# Patient Record
Sex: Male | Born: 2000 | Race: Black or African American | Hispanic: No | Marital: Single | Smoking: Never smoker
Health system: Southern US, Community
[De-identification: ages and names within clinical notes are randomized; demographics above are authoritative.]

---

## 2019-10-29 ENCOUNTER — Other Ambulatory Visit: Payer: Self-pay

## 2019-10-29 DIAGNOSIS — Z202 Contact with and (suspected) exposure to infections with a predominantly sexual mode of transmission: Secondary | ICD-10-CM

## 2019-10-30 ENCOUNTER — Encounter: Payer: Self-pay | Admitting: Family Medicine

## 2019-10-30 ENCOUNTER — Ambulatory Visit (INDEPENDENT_AMBULATORY_CARE_PROVIDER_SITE_OTHER): Payer: Self-pay | Admitting: Family Medicine

## 2019-10-30 ENCOUNTER — Other Ambulatory Visit: Payer: Self-pay | Admitting: Family Medicine

## 2019-10-30 VITALS — BP 119/71 | HR 88 | Temp 98.6°F | Ht 75.0 in | Wt 170.6 lb

## 2019-10-30 DIAGNOSIS — Z7689 Persons encountering health services in other specified circumstances: Secondary | ICD-10-CM

## 2019-10-30 DIAGNOSIS — Z09 Encounter for follow-up examination after completed treatment for conditions other than malignant neoplasm: Secondary | ICD-10-CM

## 2019-10-30 DIAGNOSIS — Z0000 Encounter for general adult medical examination without abnormal findings: Secondary | ICD-10-CM

## 2019-10-30 DIAGNOSIS — Z202 Contact with and (suspected) exposure to infections with a predominantly sexual mode of transmission: Secondary | ICD-10-CM

## 2019-10-30 DIAGNOSIS — R1031 Right lower quadrant pain: Secondary | ICD-10-CM | POA: Insufficient documentation

## 2019-10-30 DIAGNOSIS — R1032 Left lower quadrant pain: Secondary | ICD-10-CM | POA: Insufficient documentation

## 2019-10-30 DIAGNOSIS — R369 Urethral discharge, unspecified: Secondary | ICD-10-CM | POA: Insufficient documentation

## 2019-10-30 LAB — POCT URINALYSIS DIPSTICK
Bilirubin, UA: NEGATIVE
Glucose, UA: NEGATIVE
Leukocytes, UA: NEGATIVE
Nitrite, UA: NEGATIVE
Protein, UA: POSITIVE — AB
Spec Grav, UA: >=1.03 — AB (ref 1.010–1.025)
Urobilinogen, UA: 1 E.U./dL
pH, UA: 7 (ref 5.0–8.0)

## 2019-10-30 MED ORDER — CEFIXIME 400 MG PO CAPS: 400.0000 mg | ORAL_CAPSULE | Freq: Every day | ORAL | 0 refills | Status: AC

## 2019-10-30 MED ORDER — DOXYCYCLINE HYCLATE 100 MG PO TABS: 100.0000 mg | ORAL_TABLET | Freq: Two times a day (BID) | ORAL | 0 refills | Status: AC

## 2019-10-30 NOTE — Progress Notes (Signed)
[facility]   New Patient--Establish Care  Subjective:  Patient ID: Cole Landry, male    DOB: 04/09/2001  Age: 19 y.o. MRN: [id number]  CC:  Chief Complaint  Patient presents with  . New Patient (Initial Visit)    Establish Care    HPI Cole Landry is a 19 year old male who presents to Establish Care today.   History reviewed. No pertinent past medical history.  Current Status: This will be Cole Landry's initial office visit with me. He previously has not been seeing a PCP regularly for his PCP needs. Since his last office visit, he has c/o of possible Gonorrhea, which his partner was recently diagnosed. He has c/o mild lower abdominal pain, and penile discharge. He denies GI problems such as nausea, vomiting, diarrhea, and constipation. He has no reports of blood in stools, dysuria and hematuria. He denies fevers, chills, fatigue, recent infections, weight loss, and night sweats. He has not had any headaches, visual changes, dizziness, and falls. No chest pain, heart palpitations, cough and shortness of breath reported. No depression or anxiety reported today. He denies suicidal ideations, homicidal ideations, or auditory hallucinations. He denies pain today.   History reviewed. No pertinent surgical history.  History reviewed. No pertinent family history.  Social History   Socioeconomic History  . Marital status: Single    Spouse name: Not on file  . Number of children: Not on file  . Years of education: Not on file  . Highest education level: Not on file  Occupational History  . Not on file  Tobacco Use  . Smoking status: Never Smoker  . Smokeless tobacco: Never Used  Substance and Sexual Activity  . Alcohol use: Yes    Comment: 3 times a week  . Drug use: Yes    Types: Marijuana  . Sexual activity: Yes  Other Topics Concern  . Not on file  Social History Narrative  . Not on file   Social Determinants of  Health   Financial Resource Strain:   . Difficulty of Paying Living Expenses: Not on file  Food Insecurity:   . Worried About Running Out of Food in the Last Year: Not on file  . Ran Out of Food in the Last Year: Not on file  Transportation Needs:   . Lack of Transportation (Medical): Not on file  . Lack of Transportation (Non-Medical): Not on file  Physical Activity:   . Days of Exercise per Week: Not on file  . Minutes of Exercise per Session: Not on file  Stress:   . Feeling of Stress : Not on file  Social Connections:   . Frequency of Communication with Friends and Family: Not on file  . Frequency of Social Gatherings with Friends and Family: Not on file  . Attends Religious Services: Not on file  . Active Member of Clubs or Organizations: Not on file  . Attends Club or Organization Meetings: Not on file  . Marital Status: Not on file  Intimate Partner Violence:   . Fear of Current or Ex-Partner: Not on file  . Emotionally Abused: Not on file  . Physically Abused: Not on file  . Sexually Abused: Not on file    Outpatient Medications Prior to Visit  Medication Sig Dispense Refill  . cefixime (SUPRAX) 400 MG CAPS capsule Take 1 capsule (400 mg total) by mouth daily. Take 1 capsule once. 1 capsule 0  . doxycycline (VIBRA-TABS) 100 MG   tablet Take 1 tablet (100 mg total) by mouth 2 (two) times daily for 7 days. 14 tablet 0   No facility-administered medications prior to visit.    No Known Allergies  ROS Review of Systems  Constitutional: Negative.   HENT: Negative.   Eyes: Negative.   Respiratory: Negative.   Cardiovascular: Negative.   Gastrointestinal: Positive for abdominal pain (mild lower abdominal).  Endocrine: Negative.   Genitourinary: Positive for discharge.  Musculoskeletal: Negative.   Skin: Negative.   Allergic/Immunologic: Negative.   Neurological: Negative.   Hematological: Negative.   Psychiatric/Behavioral: Negative.       Objective:      Physical Exam  Constitutional: He is oriented to person, place, and time. He appears well-developed and well-nourished.  HENT:  Head: Normocephalic and atraumatic.  Eyes: Conjunctivae are normal.  Cardiovascular: Normal rate, regular rhythm, normal heart sounds and intact distal pulses.  Pulmonary/Chest: Effort normal and breath sounds normal.  Abdominal: Soft. Bowel sounds are normal.  Musculoskeletal:        General: Normal range of motion.     Cervical back: Normal range of motion and neck supple.  Neurological: He is alert and oriented to person, place, and time. He has normal reflexes.  Skin: Skin is warm and dry.  Psychiatric: He has a normal mood and affect. His behavior is normal. Judgment and thought content normal.  Nursing note and vitals reviewed.   BP 119/71   Pulse 88   Temp 98.6 F (37 C) (Oral)   Ht 6' 3" (1.905 m)   Wt 170 lb 9.6 oz (77.4 kg)   SpO2 99%   BMI 21.32 kg/m  Wt Readings from Last 3 Encounters:  10/30/19 170 lb 9.6 oz (77.4 kg)     Health Maintenance Due  Topic Date Due  . HIV Screening  08/21/2014  . TETANUS/TDAP  08/21/2018  . INFLUENZA VACCINE  05/05/2019    There are no preventive care reminders to display for this patient.  No results found for: TSH No results found for: WBC, HGB, HCT, MCV, PLT No results found for: NA, K, CHLORIDE, CO2, GLUCOSE, BUN, CREATININE, BILITOT, ALKPHOS, AST, ALT, PROT, ALBUMIN, CALCIUM, ANIONGAP, EGFR, GFR No results found for: CHOL No results found for: HDL No results found for: LDLCALC No results found for: TRIG No results found for: CHOLHDL No results found for: HGBA1C    Assessment & Plan:   1. Encounter to establish care  2. Exposure to gonorrhea  3. Penile discharge Rx for Doxycycline and Cefixime sent to pharmacy.   4. Bilateral lower abdominal discomfort  5. Health care maintenance - POCT urinalysis dipstick - CBC with Differential - Comprehensive metabolic panel; Future -  Comprehensive metabolic panel  6. Follow up He will follow up in 6 months for his Annual Physical.  No orders of the defined types were placed in this encounter.   Orders Placed This Encounter  Procedures  . CBC with Differential  . Comprehensive metabolic panel  . POCT urinalysis dipstick    Referral Orders  No referral(s) requested today    Kathe Becton,  MSN, FNP-BC [facility] [address] [place], [place] [address] [phone]- fax  Problem List Items Addressed This Visit    None    Visit Diagnoses    Encounter to establish care    -  Primary   Exposure to gonorrhea       Penile discharge  Bilateral lower abdominal discomfort       Health care maintenance       Relevant Orders   POCT urinalysis dipstick (Completed)   CBC with Differential   Comprehensive metabolic panel   Follow up          No orders of the defined types were placed in this encounter.   Follow-up: No follow-ups on file.    Natalie M Stroud, FNP 

## 2019-10-30 NOTE — Progress Notes (Unsigned)
18

## 2019-10-31 LAB — CBC WITH DIFFERENTIAL/PLATELET
Basophils Absolute: 0.1 10*3/uL (ref 0.0–0.2)
Basos: 1 %
EOS (ABSOLUTE): 0.1 10*3/uL (ref 0.0–0.4)
Eos: 1 %
Hematocrit: 41.6 % (ref 37.5–51.0)
Hemoglobin: 13.9 g/dL (ref 13.0–17.7)
Immature Grans (Abs): 0 10*3/uL (ref 0.0–0.1)
Immature Granulocytes: 0 %
Lymphocytes Absolute: 3.4 10*3/uL — ABNORMAL HIGH (ref 0.7–3.1)
Lymphs: 55 %
MCH: 31 pg (ref 26.6–33.0)
MCHC: 33.4 g/dL (ref 31.5–35.7)
MCV: 93 fL (ref 79–97)
Monocytes Absolute: 0.6 10*3/uL (ref 0.1–0.9)
Monocytes: 9 %
Neutrophils Absolute: 2.1 10*3/uL (ref 1.4–7.0)
Neutrophils: 34 %
Platelets: 224 10*3/uL (ref 150–450)
RBC: 4.48 x10E6/uL (ref 4.14–5.80)
RDW: 12.5 % (ref 11.6–15.4)
WBC: 6.2 10*3/uL (ref 3.4–10.8)

## 2019-10-31 LAB — COMPREHENSIVE METABOLIC PANEL
ALT: 12 IU/L (ref 0–44)
AST: 19 IU/L (ref 0–40)
Albumin/Globulin Ratio: 1.9 (ref 1.2–2.2)
Albumin: 4.2 g/dL (ref 4.1–5.2)
Alkaline Phosphatase: 88 IU/L (ref 39–117)
BUN/Creatinine Ratio: 11 (ref 9–20)
BUN: 13 mg/dL (ref 6–20)
Bilirubin Total: 0.4 mg/dL (ref 0.0–1.2)
CO2: 25 mmol/L (ref 20–29)
Calcium: 9.1 mg/dL (ref 8.7–10.2)
Chloride: 107 mmol/L — ABNORMAL HIGH (ref 96–106)
Creatinine, Ser: 1.16 mg/dL (ref 0.76–1.27)
GFR calc Af Amer: 104 mL/min/{1.73_m2} (ref >59)
GFR calc non Af Amer: 90 mL/min/{1.73_m2} (ref >59)
Globulin, Total: 2.2 g/dL (ref 1.5–4.5)
Glucose: 88 mg/dL (ref 65–99)
Potassium: 4.1 mmol/L (ref 3.5–5.2)
Sodium: 141 mmol/L (ref 134–144)
Total Protein: 6.4 g/dL (ref 6.0–8.5)

## 2019-11-01 LAB — TRICHOMONAS VAGINALIS, PROBE AMP: Trich vag by NAA: POSITIVE — AB

## 2019-11-01 LAB — GC/CHLAMYDIA PROBE AMP
Chlamydia trachomatis, NAA: NEGATIVE
Neisseria Gonorrhoeae by PCR: POSITIVE — AB

## 2019-11-05 ENCOUNTER — Telehealth: Payer: Self-pay

## 2019-11-05 ENCOUNTER — Other Ambulatory Visit: Payer: Self-pay | Admitting: Family Medicine

## 2019-11-05 DIAGNOSIS — A599 Trichomoniasis, unspecified: Secondary | ICD-10-CM

## 2019-11-05 MED ORDER — METRONIDAZOLE 500 MG PO TABS: ORAL_TABLET | ORAL | 0 refills | Status: AC

## 2019-11-05 NOTE — Telephone Encounter (Signed)
-----   Message from Kallie Locks, FNP sent at 11/05/2019  4:23 PM EST ----- Patient is positive for Gonorrhea, which he was previously treated.  He is also positive for Trich. We will send New Rx for Metronidazole to pharmacy today.   Keep follow up appointment. Please inform patient.

## 2019-11-05 NOTE — Telephone Encounter (Signed)
Message left for call back 

## 2019-11-05 NOTE — Telephone Encounter (Signed)
-----   Message from Kallie Locks, FNP sent at 11/05/2019  4:12 PM EST ----- All labs are stable. Keep follow up appointment. Please inform patient.

## 2019-11-22 ENCOUNTER — Emergency Department (HOSPITAL_COMMUNITY)
Admission: EM | Admit: 2019-11-22 | Discharge: 2019-11-22 | Disposition: A | Discharge status: Home / Self Care | Payer: BC Managed Care – PPO | Attending: Emergency Medicine | Admitting: Emergency Medicine

## 2019-11-22 ENCOUNTER — Emergency Department (HOSPITAL_COMMUNITY): Payer: BC Managed Care – PPO

## 2019-11-22 ENCOUNTER — Encounter (HOSPITAL_COMMUNITY): Payer: Self-pay

## 2019-11-22 ENCOUNTER — Other Ambulatory Visit: Payer: Self-pay

## 2019-11-22 DIAGNOSIS — K529 Noninfective gastroenteritis and colitis, unspecified: Secondary | ICD-10-CM | POA: Insufficient documentation

## 2019-11-22 DIAGNOSIS — Z20822 Contact with and (suspected) exposure to covid-19: Secondary | ICD-10-CM | POA: Diagnosis not present

## 2019-11-22 DIAGNOSIS — R112 Nausea with vomiting, unspecified: Secondary | ICD-10-CM | POA: Diagnosis present

## 2019-11-22 DIAGNOSIS — E876 Hypokalemia: Secondary | ICD-10-CM | POA: Diagnosis not present

## 2019-11-22 DIAGNOSIS — R197 Diarrhea, unspecified: Secondary | ICD-10-CM

## 2019-11-22 LAB — URINALYSIS, ROUTINE W REFLEX MICROSCOPIC
Bacteria, UA: NONE SEEN
Bilirubin Urine: NEGATIVE
Glucose, UA: NEGATIVE mg/dL
Hgb urine dipstick: NEGATIVE
Ketones, ur: 5 mg/dL — AB
Leukocytes,Ua: NEGATIVE
Nitrite: NEGATIVE
Protein, ur: 30 mg/dL — AB
Specific Gravity, Urine: 1.024 (ref 1.005–1.030)
pH: 5 (ref 5.0–8.0)

## 2019-11-22 LAB — COMPREHENSIVE METABOLIC PANEL
ALT: 18 U/L (ref 0–44)
AST: 19 U/L (ref 15–41)
Albumin: 4.2 g/dL (ref 3.5–5.0)
Alkaline Phosphatase: 62 U/L (ref 38–126)
Anion gap: 10 (ref 5–15)
BUN: 11 mg/dL (ref 6–20)
CO2: 25 mmol/L (ref 22–32)
Calcium: 9.4 mg/dL (ref 8.9–10.3)
Chloride: 103 mmol/L (ref 98–111)
Creatinine, Ser: 1.16 mg/dL (ref 0.61–1.24)
GFR calc Af Amer: >60 mL/min (ref >60)
GFR calc non Af Amer: >60 mL/min (ref >60)
Glucose, Bld: 100 mg/dL — ABNORMAL HIGH (ref 70–99)
Potassium: 3.4 mmol/L — ABNORMAL LOW (ref 3.5–5.1)
Sodium: 138 mmol/L (ref 135–145)
Total Bilirubin: 0.5 mg/dL (ref 0.3–1.2)
Total Protein: 7.5 g/dL (ref 6.5–8.1)

## 2019-11-22 LAB — CBC
HCT: 44.3 % (ref 39.0–52.0)
Hemoglobin: 15.5 g/dL (ref 13.0–17.0)
MCH: 31.9 pg (ref 26.0–34.0)
MCHC: 35 g/dL (ref 30.0–36.0)
MCV: 91.2 fL (ref 80.0–100.0)
Platelets: 189 10*3/uL (ref 150–400)
RBC: 4.86 MIL/uL (ref 4.22–5.81)
RDW: 11.2 % — ABNORMAL LOW (ref 11.5–15.5)
WBC: 8.8 10*3/uL (ref 4.0–10.5)
nRBC: 0 % (ref 0.0–0.2)

## 2019-11-22 LAB — C DIFFICILE QUICK SCREEN W PCR REFLEX
C Diff antigen: NEGATIVE
C Diff interpretation: NOT DETECTED
C Diff toxin: NEGATIVE

## 2019-11-22 LAB — POC SARS CORONAVIRUS 2 AG -  ED: SARS Coronavirus 2 Ag: NEGATIVE

## 2019-11-22 LAB — LIPASE, BLOOD: Lipase: 18 U/L (ref 11–51)

## 2019-11-22 MED ORDER — SODIUM CHLORIDE 0.9% FLUSH
3.0000 mL | Freq: Once | INTRAVENOUS | Status: AC
  Administered 2019-11-22: 3 mL via INTRAVENOUS

## 2019-11-22 MED ORDER — SODIUM CHLORIDE (PF) 0.9 % IJ SOLN
INTRAMUSCULAR | Status: AC
  Filled 2019-11-22: qty 50

## 2019-11-22 MED ORDER — DIPHENOXYLATE-ATROPINE 2.5-0.025 MG PO TABS: 1.0000 | ORAL_TABLET | Freq: Four times a day (QID) | ORAL | 0 refills | Status: AC | PRN

## 2019-11-22 MED ORDER — IOHEXOL 300 MG/ML  SOLN
100.0000 mL | Freq: Once | INTRAMUSCULAR | Status: AC | PRN
  Administered 2019-11-22: 100 mL via INTRAVENOUS

## 2019-11-22 MED ORDER — ONDANSETRON HCL 4 MG/2ML IJ SOLN
4.0000 mg | Freq: Once | INTRAMUSCULAR | Status: AC
  Administered 2019-11-22: 4 mg via INTRAVENOUS
  Filled 2019-11-22: qty 2

## 2019-11-22 MED ORDER — SODIUM CHLORIDE 0.9 % IV SOLN
1000.0000 mL | INTRAVENOUS | Status: DC
  Administered 2019-11-22: 1000 mL via INTRAVENOUS

## 2019-11-22 MED ORDER — SODIUM CHLORIDE 0.9 % IV BOLUS (SEPSIS)
1000.0000 mL | Freq: Once | INTRAVENOUS | Status: AC
  Administered 2019-11-22: 1000 mL via INTRAVENOUS

## 2019-11-22 MED ORDER — MORPHINE SULFATE (PF) 4 MG/ML IV SOLN
4.0000 mg | Freq: Once | INTRAVENOUS | Status: AC
  Administered 2019-11-22: 4 mg via INTRAVENOUS
  Filled 2019-11-22: qty 1

## 2019-11-22 MED ORDER — ONDANSETRON 8 MG PO TBDP: 8.0000 mg | ORAL_TABLET | Freq: Three times a day (TID) | ORAL | 0 refills | Status: AC | PRN

## 2019-11-22 NOTE — ED Notes (Signed)
Pt transported to CT ?

## 2019-11-22 NOTE — ED Triage Notes (Signed)
Patient reports that he has had abdominal pain,nausea and diarrhea since 11/18/19.

## 2019-11-22 NOTE — ED Notes (Signed)
Pt provided urinal and bedside commode for stool and urine sample. Pt aware sample needed/

## 2019-11-22 NOTE — ED Notes (Signed)
Pt verbalizes understanding of DC instructions. Pt belongings returned and is ambulatory out of ED.  

## 2019-11-22 NOTE — ED Provider Notes (Signed)
[facility]-EMERGENCY DEPT Provider Note   CSN: [id number] Arrival date & time: 11/22/19  1249     History Chief Complaint  Patient presents with   Nausea   Diarrhea   Abdominal Pain    Cole Landry is a 19 y.o. male.  HPI Cole Landry is a 19 y.o. male presents to ED with complaint of nausea, diarrhea, abdominal pain symptoms started 4 days ago with diarrhea.  He reports numerous episodes of watery diarrhea, greater than 10 each day, with some blood in his stool noted.  He also reports left-sided abdominal pain.  Pain is worsened with any movement or palpation of the abdomen.  Patient also reports fever that was there just day one of his illness.  He reports persistent nausea but states he has not been vomiting.  He states he is unable to eat but able to drink small sips of water.  He does admit to recent antibiotics, was treated with Suprax 400 mg 1 time capsule and 1 week of doxycycline 3 weeks ago for STD exposure.  He also had Flagyl for positive trichomonas infection.  He denies any respiratory symptoms.  No sore throat congestion.  No headache.  No chest pain or cough.  No shortness of breath.  No exposure to anybody sick.  He has not tried any medications prior to coming in.  No other complaints.     History reviewed. No pertinent past medical history.  Patient Active Problem List   Diagnosis Date Noted   Exposure to gonorrhea 10/30/2019   Penile discharge 10/30/2019   Bilateral lower abdominal discomfort 10/30/2019    History reviewed. No pertinent surgical history.     History reviewed. No pertinent family history.  Social History   Tobacco Use   Smoking status: Never Smoker   Smokeless tobacco: Never Used  Substance Use Topics   Alcohol use: Yes    Comment: 3 times a week   Drug use: Yes    Types: Marijuana    Home Medications Prior to Admission medications   Medication Sig Start Date End Date Taking? Authorizing  Provider  cefixime (SUPRAX) 400 MG CAPS capsule Take 1 capsule (400 mg total) by mouth daily. Take 1 capsule once. 10/30/19   Kallie Locks, FNP  metroNIDAZOLE (FLAGYL) 500 MG tablet Take 4 tablets (2,000 mg= total), by mouth once. 11/05/19   Kallie Locks, FNP    Allergies    Patient has no known allergies.  Review of Systems   Review of Systems  Constitutional: Positive for chills and fever.  Respiratory: Negative for cough, chest tightness and shortness of breath.   Cardiovascular: Negative for chest pain, palpitations and leg swelling.  Gastrointestinal: Positive for abdominal pain, blood in stool, diarrhea and nausea. Negative for abdominal distention, rectal pain and vomiting.  Genitourinary: Negative for dysuria, frequency, hematuria and urgency.  Musculoskeletal: Negative for arthralgias, myalgias, neck pain and neck stiffness.  Skin: Negative for rash.  Allergic/Immunologic: Negative for immunocompromised state.  Neurological: Negative for dizziness, weakness, light-headedness, numbness and headaches.  All other systems reviewed and are negative.   Physical Exam Updated Vital Signs BP 130/78 (BP Location: Left Arm)    Pulse 98    Temp 98 F (36.7 C) (Oral)    Resp 16    Ht 6\' 5"  (1.956 m)    Wt 74.8 kg    SpO2 100%    BMI 19.57 kg/m   Physical Exam Vitals and nursing note reviewed.  Constitutional:      General: He is not in acute distress.    Appearance: He is well-developed.  HENT:     Head: Normocephalic and atraumatic.     Mouth/Throat:     Comments: Oral mucosa dry Eyes:     Conjunctiva/sclera: Conjunctivae normal.  Cardiovascular:     Rate and Rhythm: Normal rate and regular rhythm.     Heart sounds: Normal heart sounds.  Pulmonary:     Effort: Pulmonary effort is normal. No respiratory distress.     Breath sounds: No wheezing or rales.  Abdominal:     General: Bowel sounds are normal. There is no distension.     Palpations: Abdomen is soft.      Tenderness: There is abdominal tenderness in the left lower quadrant. There is guarding. There is no rebound.  Musculoskeletal:     Cervical back: Neck supple.  Skin:    General: Skin is warm and dry.  Neurological:     Mental Status: He is alert.     ED Results / Procedures / Treatments   Labs (all labs ordered are listed, but only abnormal results are displayed) Labs Reviewed  COMPREHENSIVE METABOLIC PANEL - Abnormal; Notable for the following components:      Result Value   Potassium 3.4 (*)    Glucose, Bld 100 (*)    All other components within normal limits  CBC - Abnormal; Notable for the following components:   RDW 11.2 (*)    All other components within normal limits  URINALYSIS, ROUTINE W REFLEX MICROSCOPIC - Abnormal; Notable for the following components:   APPearance HAZY (*)    Ketones, ur 5 (*)    Protein, ur 30 (*)    All other components within normal limits  C DIFFICILE QUICK SCREEN W PCR REFLEX  NOVEL CORONAVIRUS, NAA (HOSP ORDER, SEND-OUT TO REF LAB; TAT 18-24 HRS)  GI PATHOGEN PANEL BY PCR, STOOL  LIPASE, BLOOD  POC SARS CORONAVIRUS 2 AG -  ED    EKG None  Radiology CT ABDOMEN PELVIS W CONTRAST  Result Date: 11/22/2019 CLINICAL DATA:  Abdominal pain with nausea and diarrhea EXAM: CT ABDOMEN AND PELVIS WITH CONTRAST TECHNIQUE: Multidetector CT imaging of the abdomen and pelvis was performed using the standard protocol following bolus administration of intravenous contrast. CONTRAST:  OMNIPAQUE IOHEXOL 300 MG/ML  SOLN COMPARISON:  None. FINDINGS: Lower chest: Lung bases are clear. Hepatobiliary: No focal liver lesions are evident. The gallbladder wall is not appreciably thickened. There is no biliary duct dilatation. Pancreas: There is no pancreatic mass or inflammatory focus. Spleen: No splenic lesions are evident. Adrenals/Urinary Tract: Adrenals bilaterally appear normal. Kidneys bilaterally show no evident mass or hydronephrosis on either side.  There is no evident renal or ureteral calculus on either side. Urinary bladder is midline with wall thickness within normal limits. Stomach/Bowel: There is moderate fluid in the rectum and distal sigmoid colon. A lesser degree of fluid is noted elsewhere in the colon. There is no appreciable bowel wall or mesenteric thickening. There is no evident bowel obstruction. The terminal ileum appears normal. No free air or portal venous air. Vascular/Lymphatic: There is no abdominal aortic aneurysm. No arterial vascular lesions are evident. Major venous structures appear patent. Reproductive: Prostate and seminal vesicles are normal in size and contour. No evident pelvic mass. Other: There is no evident appendiceal inflammation. No abscess or ascites evident in the abdomen or pelvis. Musculoskeletal: No blastic or lytic bone lesions. No intramuscular or  abdominal wall lesions evident. IMPRESSION: 1. Moderate fluid in the distal sigmoid colon and rectal region. Question residua of diarrhea. No bowel wall thickening or bowel obstruction evident. 2. No evident abscess in the abdomen or pelvis. Appendix region appears unremarkable. 3. No renal or ureteral calculus. No hydronephrosis. Urinary bladder wall thickness normal. Electronically Signed   By: Lowella Grip III M.D.   On: 11/22/2019 14:50    Procedures Procedures (including critical care time)  Medications Ordered in ED Medications  sodium chloride 0.9 % bolus 1,000 mL (0 mLs Intravenous Stopped 11/22/19 1504)    Followed by  0.9 %  sodium chloride infusion (has no administration in time range)  sodium chloride (PF) 0.9 % injection (has no administration in time range)  sodium chloride flush (NS) 0.9 % injection 3 mL (3 mLs Intravenous Given 11/22/19 1336)  morphine 4 MG/ML injection 4 mg (4 mg Intravenous Given 11/22/19 1335)  ondansetron (ZOFRAN) injection 4 mg (4 mg Intravenous Given 11/22/19 1335)  iohexol (OMNIPAQUE) 300 MG/ML solution 100 mL (100 mLs  Intravenous Contrast Given 11/22/19 1435)    ED Course  I have reviewed the triage vital signs and the nursing notes.  Pertinent labs & imaging results that were available during my care of the patient were reviewed by me and considered in my medical decision making (see chart for details).  1:18 PM Patient seen and examined, patient with severe left-sided abdominal pain, with guarding on my exam, persistent diarrhea, nausea, appears to be dry.  Vital signs all within normal.  Will get labs, start IV fluids, Zofran and morphine ordered for his symptoms.  Given amount of tenderness and guarding I will get CT abdomen pelvis to rule out colitis, diverticulitis, atypical appendicitis.  Patient has had some recent biotics, we will also get stool cultures to rule out C. difficile colitis.  Will monitor.  3:00 PM Labs unremarkable, slightly low potassium at 3.4.  Rapid Covid test is negative.  Normal LFTs and lipase.  Patient hydrated with IV fluids.  His pain improved with morphine and nausea has improved with Zofran.  He has not had any episodes of emesis in the emergency department. His abdominal CT came back with fluid in distal sigmoid and rectal area, consistent with diarrhea.  No other acute findings.  Patient was able to provide [place] a stool sample that is dark green and liquid.  We will send it for C. difficile and stool cultures.  Rapid Covid test was negative, PCR was sent.  I will discharge him home with Lomotil and Zofran.  Advised to eat potassium rich foods since potassium is slightly low, and drink plenty of fluids.  Return as needed.  Patient agreed to the plan. Patient stable for discharge home.  Vitals:   11/22/19 1257 11/22/19 1259 11/22/19 1330  BP: 130/78  124/74  Pulse: 98    Resp: 16  18  Temp: 98 F (36.7 C)    TempSrc: Oral    SpO2: 100%  98%  Weight:  74.8 kg   Height:  6\' 5"  (1.956 m)        MDM Rules/Calculators/A&P                      Patient with nausea,  vomiting, diarrhea for 4 days.  Initially very tender on exam in the left lower quadrant.  Recent antibiotics.  Considered colitis, diverticulitis, C. difficile.  CT obtained due to the amount of tenderness and came back unremarkable.  Labs are all within normal other than a slightly low potassium level.  Patient hydrated, rapid Covid test negative, PCR will be sent.  We will also send stool cultures as well as C. difficile cultures.  Suspect this is most likely viral gastroenteritis.  We will discharge home with close outpatient follow up.  Strict return precautions discussed.    Final Clinical Impression(s) / ED Diagnoses Final diagnoses:  Gastroenteritis  Nausea vomiting and diarrhea  Hypokalemia    Rx / DC Orders ED Discharge Orders         Ordered    diphenoxylate-atropine (LOMOTIL) 2.5-0.025 MG tablet  4 times daily PRN     11/22/19 1508    ondansetron (ZOFRAN ODT) 8 MG disintegrating tablet  Every 8 hours PRN     11/22/19 1508           Jaynie Crumble, PA-C 11/22/19 1511    Geoffery Lyons, MD 11/23/19 [phone]

## 2019-11-22 NOTE — Discharge Instructions (Addendum)
Drink plenty of fluids. Potassium rich foods. Lomotil for diarrhea as needed. Zofran for nausea and vomiting. We will call you if your C.Diff or Stool cultures return abnormal. Return if worsening symptoms.

## 2019-11-23 LAB — NOVEL CORONAVIRUS, NAA (HOSP ORDER, SEND-OUT TO REF LAB; TAT 18-24 HRS): SARS-CoV-2, NAA: NOT DETECTED

## 2019-11-25 LAB — GI PATHOGEN PANEL BY PCR, STOOL
Adenovirus F 40/41: NOT DETECTED
Astrovirus: NOT DETECTED
Campylobacter by PCR: NOT DETECTED
Cryptosporidium by PCR: DETECTED — AB
Cyclospora cayetanensis: NOT DETECTED
E coli (ETEC) LT/ST: NOT DETECTED
E coli (STEC): NOT DETECTED
Entamoeba histolytica: NOT DETECTED
Enteroaggregative E coli: DETECTED — AB
Enteropathogenic E coli: DETECTED — AB
G lamblia by PCR: DETECTED — AB
Norovirus GI/GII: NOT DETECTED
Plesiomonas shigelloides: NOT DETECTED
Rotavirus A by PCR: NOT DETECTED
Salmonella by PCR: NOT DETECTED
Sapovirus: NOT DETECTED
Shigella by PCR: DETECTED — AB
Vibrio cholerae: NOT DETECTED
Vibrio: NOT DETECTED
Yersinia enterocolitica: NOT DETECTED

## 2019-11-26 ENCOUNTER — Other Ambulatory Visit: Payer: Self-pay | Admitting: Family Medicine

## 2019-11-26 ENCOUNTER — Telehealth: Payer: Self-pay | Admitting: Family Medicine

## 2019-11-26 NOTE — Telephone Encounter (Signed)
Pt wants to know how to treat Ecoli found in test results.

## 2019-11-26 NOTE — Telephone Encounter (Addendum)
Mr.Frampton was only given Ondansetron and Lomotil. He is still having loose stools and nausea. And reported vomiting today. (Dx Gastritis in ED 11/21/2018). Please review ED labs it does show Ecoli. ,

## 2019-11-27 ENCOUNTER — Telehealth (HOSPITAL_COMMUNITY): Payer: Self-pay

## 2019-11-27 ENCOUNTER — Telehealth: Payer: Self-pay

## 2019-11-27 NOTE — Telephone Encounter (Signed)
Dorene Grebe NP's message left on Cole Landry voicemail. Patient is to call back it symptoms worsen or does not improve. Also asked to call back on tomorrow with an update (mid week).

## 2019-11-28 ENCOUNTER — Telehealth: Payer: Self-pay | Admitting: Family Medicine

## 2019-11-28 NOTE — Telephone Encounter (Signed)
Called pt 2 days in a row. No answer/ response. Left a message both times. 

## 2020-04-25 ENCOUNTER — Encounter: Payer: Self-pay | Admitting: Family Medicine

## 2021-05-30 IMAGING — CT CT ABD-PELV W/ CM
2 of 4 series · 16 of 46 positions shown, 18 images · IV contrast (OMNIPAQUE 300)
Comparison: None.

CLINICAL DATA: Abdominal pain with nausea and diarrhea

EXAM:
CT ABDOMEN AND PELVIS WITH CONTRAST
TECHNIQUE: Multidetector CT imaging of the abdomen and pelvis was performed
using the standard protocol following bolus administration of
intravenous contrast.
CONTRAST:  100mL OMNIPAQUE IOHEXOL 300 MG/ML  SOLN

[Series 2: axial st · axial · 0.71mm/px · z∈[-410,+5]mm · 13 of 95 slices shown, 15 images]
[im 6/95  soft-tissue]
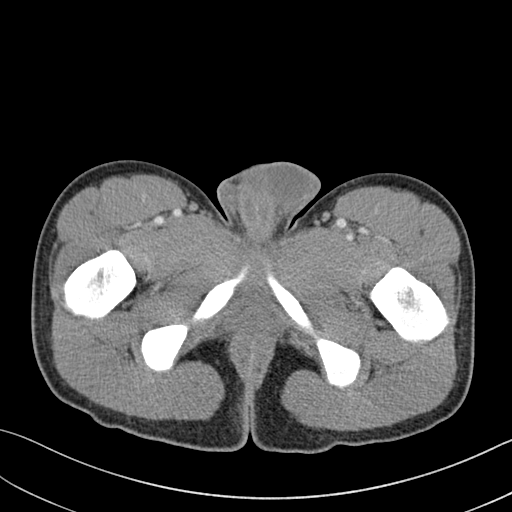
[im 6/95  bone]
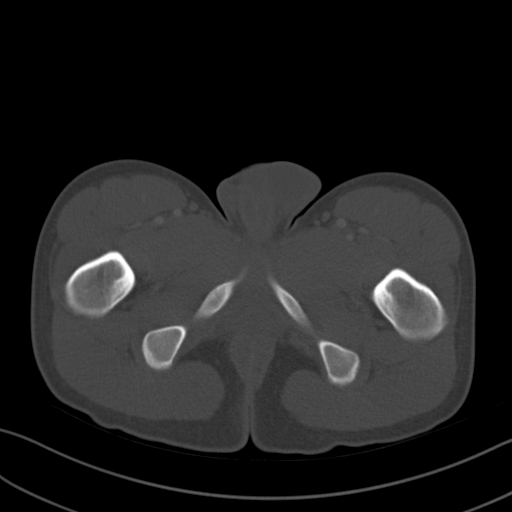
[im 11/95  soft-tissue]
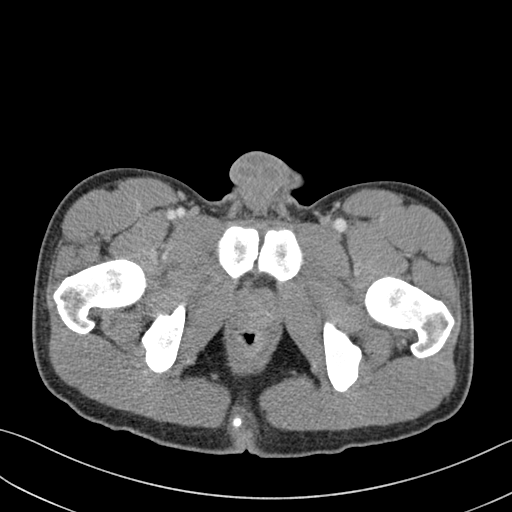
[im 21/95  soft-tissue]
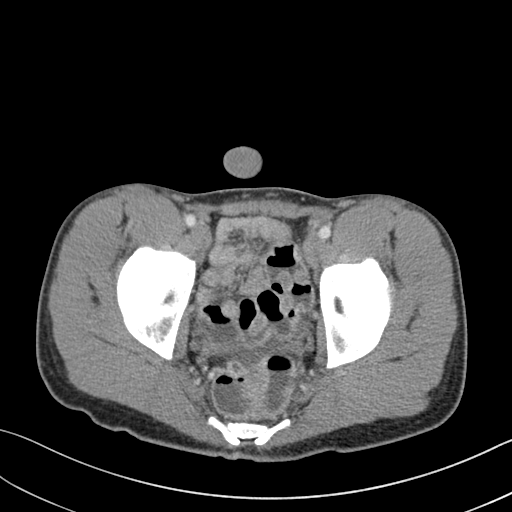
[im 27/95  soft-tissue]
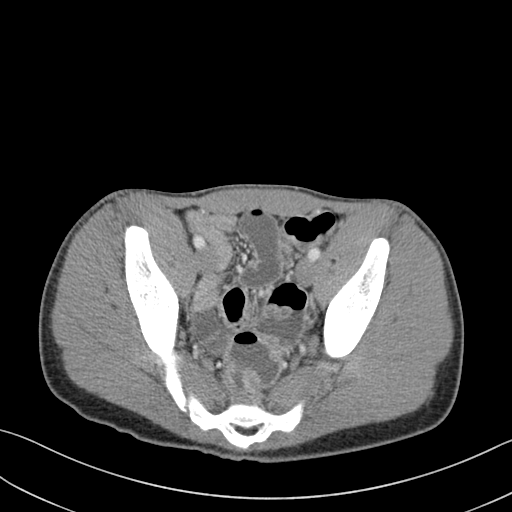
[im 32/95  soft-tissue]
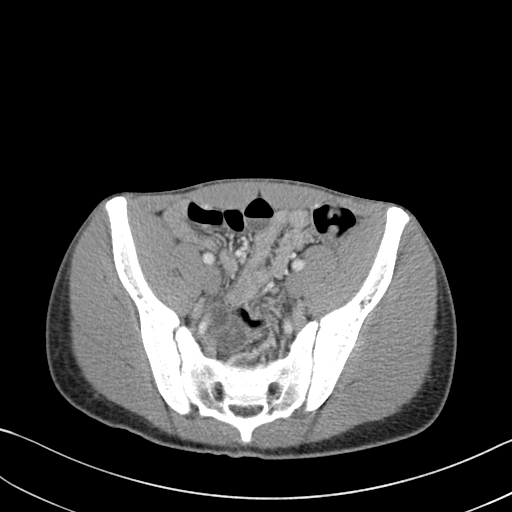
[im 42/95  soft-tissue]
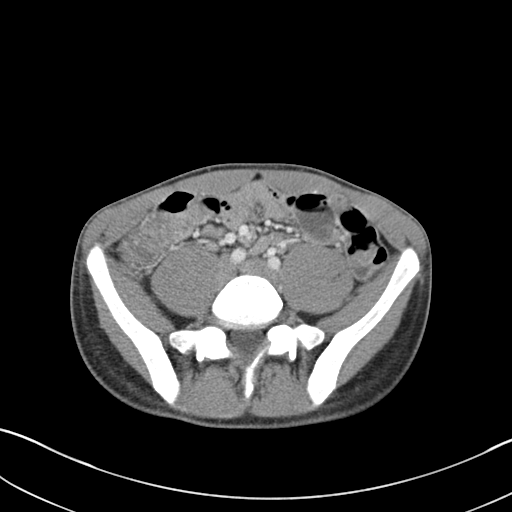
[im 48/95  soft-tissue]
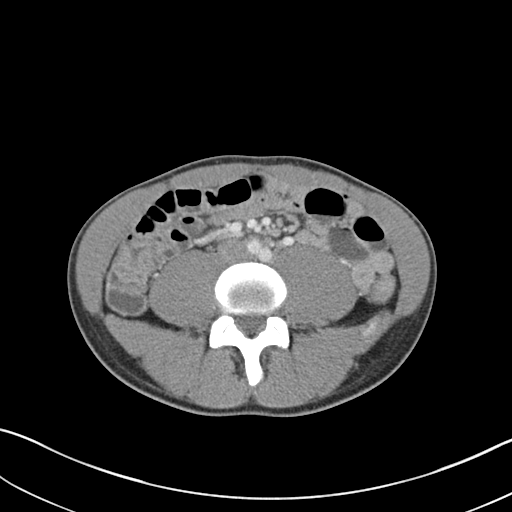
[im 53/95  soft-tissue]
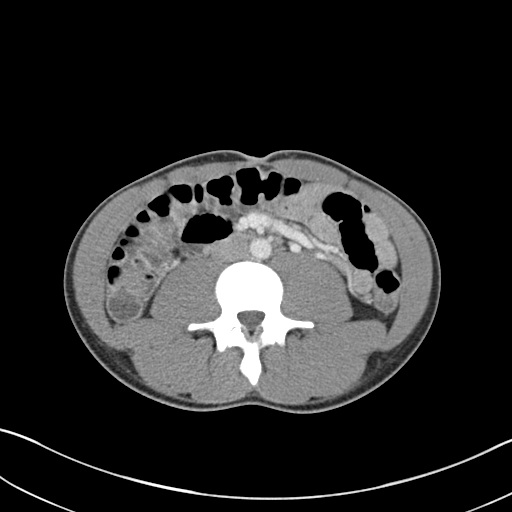
[im 63/95  soft-tissue]
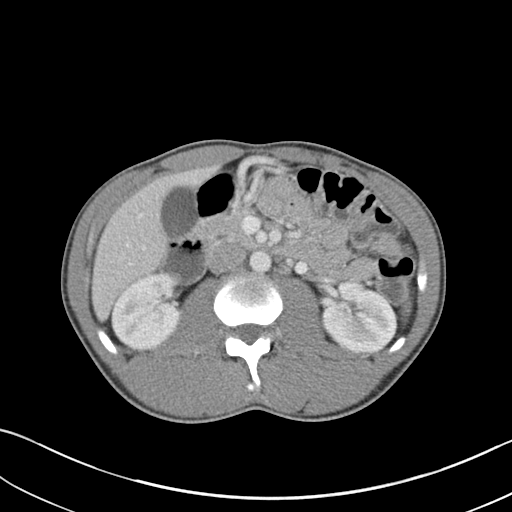
[im 63/95  bone]
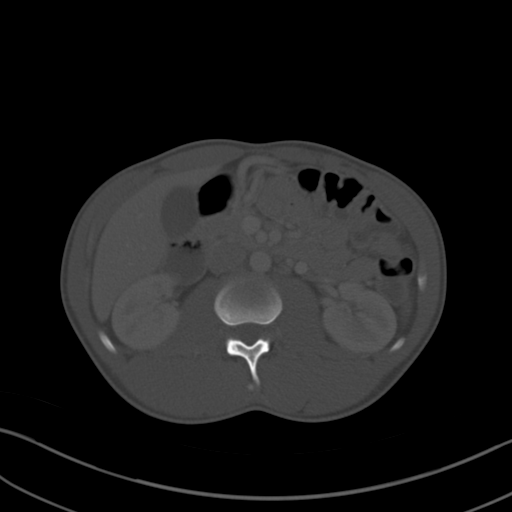
[im 68/95  soft-tissue]
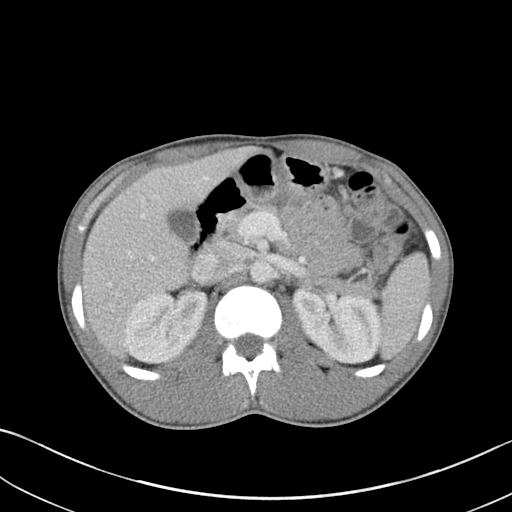
[im 74/95  soft-tissue]
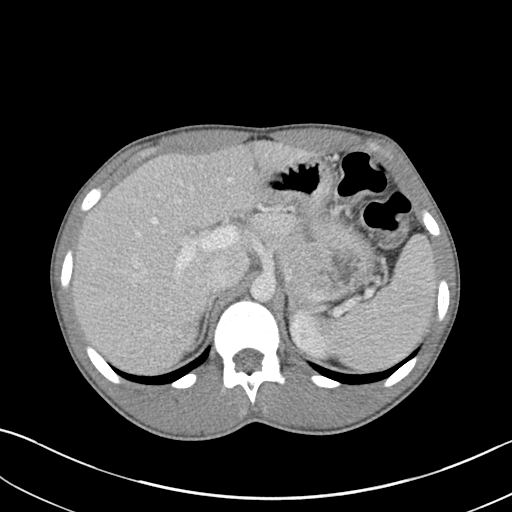
[im 84/95  soft-tissue]
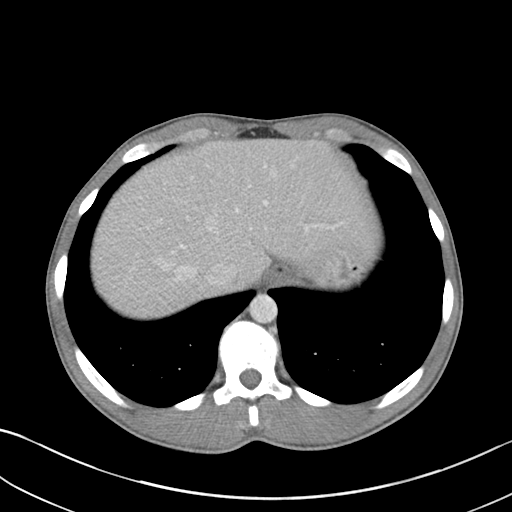
[im 89/95  soft-tissue]
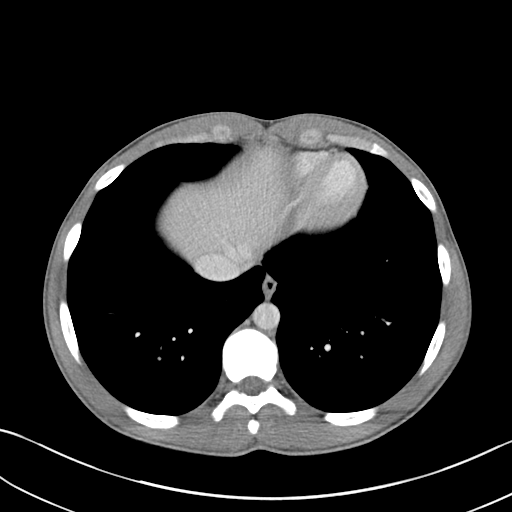

[Series 4: coronal st · coronal · 0.93mm/px · 3 of 112 slices shown]
[im 38/112  soft-tissue]
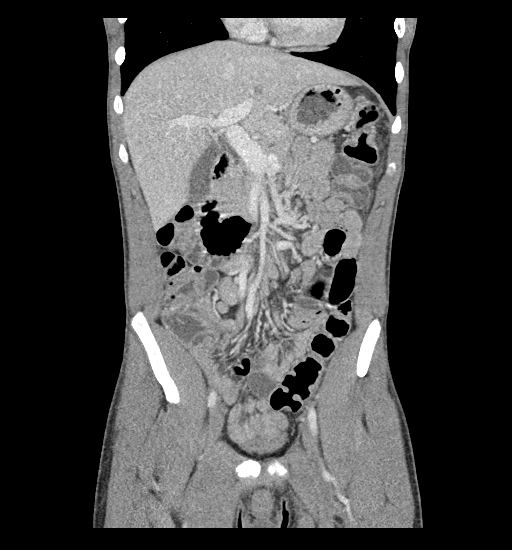
[im 50/112  soft-tissue]
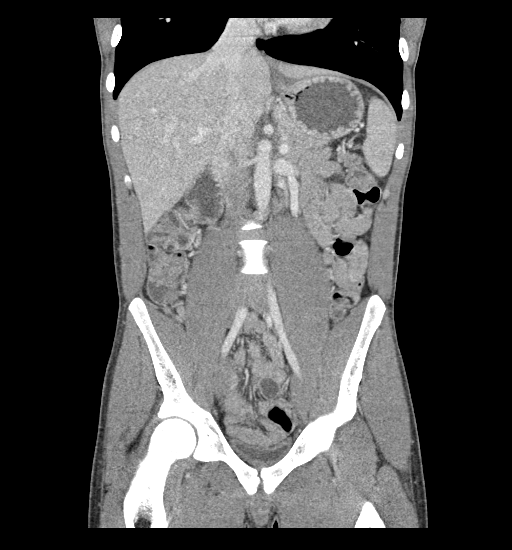
[im 62/112  soft-tissue]
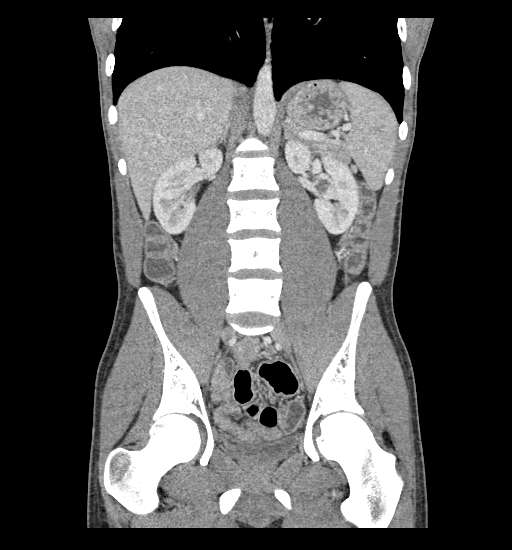

[16 of 46 positions shown; findings below may reference images not displayed]

FINDINGS: Lower chest: Lung bases are clear.

Hepatobiliary: No focal liver lesions are evident. The gallbladder
wall is not appreciably thickened. There is no biliary duct
dilatation.

Pancreas: There is no pancreatic mass or inflammatory focus.

Spleen: No splenic lesions are evident.

Adrenals/Urinary Tract: Adrenals bilaterally appear normal. Kidneys
bilaterally show no evident mass or hydronephrosis on either side.
There is no evident renal or ureteral calculus on either side.
Urinary bladder is midline with wall thickness within normal limits.

Stomach/Bowel: There is moderate fluid in the rectum and distal
sigmoid colon. A lesser degree of fluid is noted elsewhere in the
colon. There is no appreciable bowel wall or mesenteric thickening.
There is no evident bowel obstruction. The terminal ileum appears
normal. No free air or portal venous air.

Vascular/Lymphatic: There is no abdominal aortic aneurysm. No
arterial vascular lesions are evident. Major venous structures
appear patent.

Reproductive: Prostate and seminal vesicles are normal in size and
contour. No evident pelvic mass.

Other: There is no evident appendiceal inflammation. No abscess or
ascites evident in the abdomen or pelvis.

Musculoskeletal: No blastic or lytic bone lesions. No intramuscular
or abdominal wall lesions evident.
IMPRESSION: 1. Moderate fluid in the distal sigmoid colon and rectal region.
Question residua of diarrhea. No bowel wall thickening or bowel
obstruction evident.

2. No evident abscess in the abdomen or pelvis. Appendix region
appears unremarkable.

3. No renal or ureteral calculus. No hydronephrosis. Urinary bladder
wall thickness normal.
